# Patient Record
Sex: Female | Born: 1970 | Race: Asian | Hispanic: No | Marital: Married
Health system: Southern US, Community
[De-identification: ages and names within clinical notes are randomized; demographics above are authoritative.]

---

## 1998-06-19 ENCOUNTER — Ambulatory Visit (HOSPITAL_COMMUNITY): Admission: RE | Admit: 1998-06-19 | Discharge: 1998-06-19 | Payer: Self-pay | Admitting: *Deleted

## 1998-08-10 ENCOUNTER — Inpatient Hospital Stay (HOSPITAL_COMMUNITY): Admission: AD | Admit: 1998-08-10 | Discharge: 1998-08-10 | Payer: Self-pay | Admitting: Obstetrics

## 1998-08-11 ENCOUNTER — Inpatient Hospital Stay (HOSPITAL_COMMUNITY): Admission: AD | Admit: 1998-08-11 | Discharge: 1998-08-14 | Payer: Self-pay | Admitting: *Deleted

## 1998-08-17 ENCOUNTER — Inpatient Hospital Stay (HOSPITAL_COMMUNITY): Admission: AD | Admit: 1998-08-17 | Discharge: 1998-08-17 | Payer: Self-pay | Admitting: Obstetrics & Gynecology

## 1998-09-28 ENCOUNTER — Inpatient Hospital Stay (HOSPITAL_COMMUNITY): Admission: AD | Admit: 1998-09-28 | Discharge: 1998-10-01 | Payer: Self-pay | Admitting: Obstetrics & Gynecology

## 1998-09-28 ENCOUNTER — Encounter (INDEPENDENT_AMBULATORY_CARE_PROVIDER_SITE_OTHER): Payer: Self-pay | Admitting: Specialist

## 1998-10-05 ENCOUNTER — Inpatient Hospital Stay (HOSPITAL_COMMUNITY): Admission: AD | Admit: 1998-10-05 | Discharge: 1998-10-05 | Payer: Self-pay | Admitting: Obstetrics & Gynecology

## 1999-06-01 ENCOUNTER — Emergency Department (HOSPITAL_COMMUNITY): Admission: EM | Admit: 1999-06-01 | Discharge: 1999-06-01 | Payer: Self-pay | Admitting: Emergency Medicine

## 2000-04-03 ENCOUNTER — Emergency Department (HOSPITAL_COMMUNITY): Admission: EM | Admit: 2000-04-03 | Discharge: 2000-04-03 | Payer: Self-pay | Admitting: Emergency Medicine

## 2000-04-03 ENCOUNTER — Encounter: Payer: Self-pay | Admitting: Emergency Medicine

## 2000-04-04 ENCOUNTER — Emergency Department (HOSPITAL_COMMUNITY): Admission: EM | Admit: 2000-04-04 | Discharge: 2000-04-04 | Payer: Self-pay | Admitting: Emergency Medicine

## 2001-03-29 ENCOUNTER — Inpatient Hospital Stay (HOSPITAL_COMMUNITY): Admission: AD | Admit: 2001-03-29 | Discharge: 2001-03-29 | Payer: Self-pay | Admitting: Obstetrics & Gynecology

## 2001-03-29 ENCOUNTER — Encounter: Payer: Self-pay | Admitting: Obstetrics & Gynecology

## 2001-07-10 ENCOUNTER — Inpatient Hospital Stay (HOSPITAL_COMMUNITY): Admission: AD | Admit: 2001-07-10 | Discharge: 2001-07-10 | Payer: Self-pay | Admitting: *Deleted

## 2001-07-13 ENCOUNTER — Encounter: Payer: Self-pay | Admitting: *Deleted

## 2001-07-13 ENCOUNTER — Encounter (HOSPITAL_COMMUNITY): Admission: RE | Admit: 2001-07-13 | Discharge: 2001-08-12 | Payer: Self-pay | Admitting: *Deleted

## 2001-08-07 ENCOUNTER — Encounter: Admission: RE | Admit: 2001-08-07 | Discharge: 2001-08-07 | Payer: Self-pay | Admitting: *Deleted

## 2001-09-20 ENCOUNTER — Inpatient Hospital Stay (HOSPITAL_COMMUNITY): Admission: AD | Admit: 2001-09-20 | Discharge: 2001-09-20 | Payer: Self-pay | Admitting: *Deleted

## 2001-10-17 ENCOUNTER — Observation Stay (HOSPITAL_COMMUNITY): Admission: AD | Admit: 2001-10-17 | Discharge: 2001-10-17 | Payer: Self-pay | Admitting: *Deleted

## 2001-10-30 ENCOUNTER — Inpatient Hospital Stay (HOSPITAL_COMMUNITY): Admission: AD | Admit: 2001-10-30 | Discharge: 2001-11-01 | Payer: Self-pay | Admitting: *Deleted

## 2006-02-02 ENCOUNTER — Ambulatory Visit: Payer: Self-pay | Admitting: Gynecology

## 2006-02-16 ENCOUNTER — Encounter (INDEPENDENT_AMBULATORY_CARE_PROVIDER_SITE_OTHER): Payer: Self-pay | Admitting: Specialist

## 2006-02-16 ENCOUNTER — Ambulatory Visit: Payer: Self-pay | Admitting: Gynecology

## 2006-02-16 ENCOUNTER — Other Ambulatory Visit: Admission: RE | Admit: 2006-02-16 | Discharge: 2006-02-16 | Payer: Self-pay | Admitting: Obstetrics and Gynecology

## 2007-07-01 ENCOUNTER — Emergency Department (HOSPITAL_COMMUNITY): Admission: EM | Admit: 2007-07-01 | Discharge: 2007-07-01 | Payer: Self-pay | Admitting: Family Medicine

## 2007-07-02 ENCOUNTER — Emergency Department (HOSPITAL_COMMUNITY): Admission: EM | Admit: 2007-07-02 | Discharge: 2007-07-02 | Payer: Self-pay | Admitting: Emergency Medicine

## 2009-08-12 ENCOUNTER — Other Ambulatory Visit: Admission: RE | Admit: 2009-08-12 | Discharge: 2009-08-12 | Payer: Self-pay | Admitting: Family Medicine

## 2009-09-05 IMAGING — CT CT HEAD W/O CM
1 of 2 series · 16 of 30 positions shown, 20 images · non-contrast
Comparison: 

CLINICAL DATA: FALL FIELD NONE

CT HEAD WITHOUT CONTRAST
TECHNIQUE: Contiguous axial images were obtained from the base of
the skull through the vertex without contrast

[Series 3: recon 2: brain · axial · 0.47mm/px · z∈[+147,+290]mm · 16 of 64 slices shown, 20 images]
[im 4/64  brain]
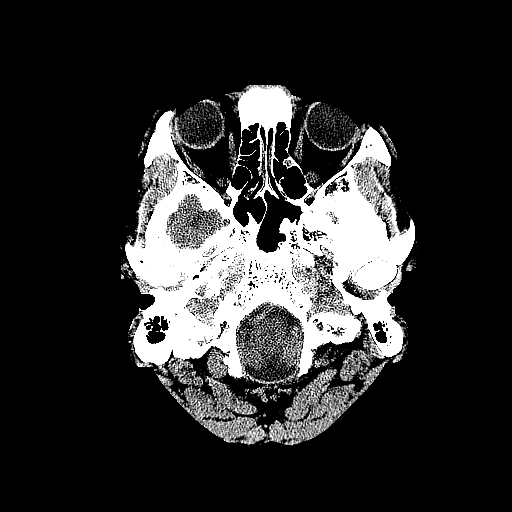
[im 4/64  bone]
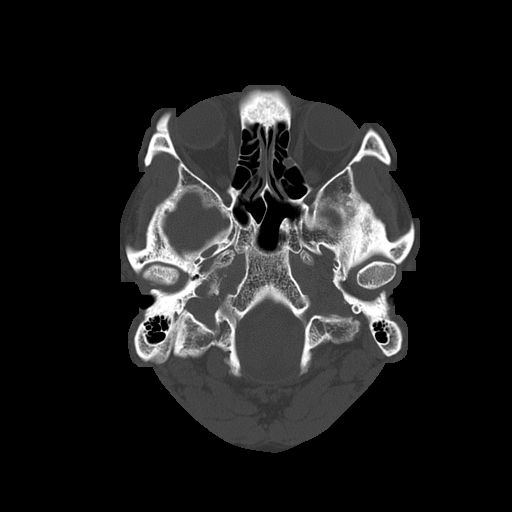
[im 7/64  brain]
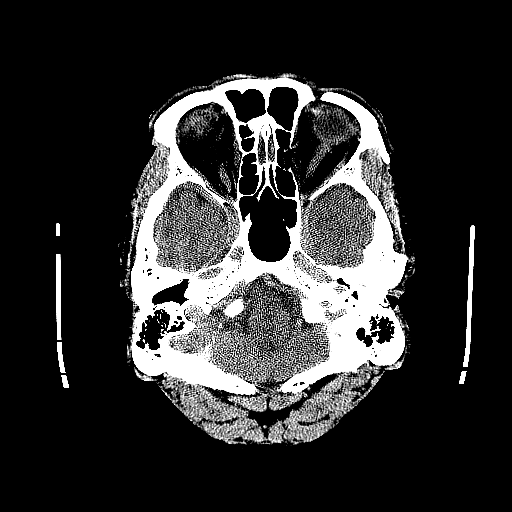
[im 10/64  brain]
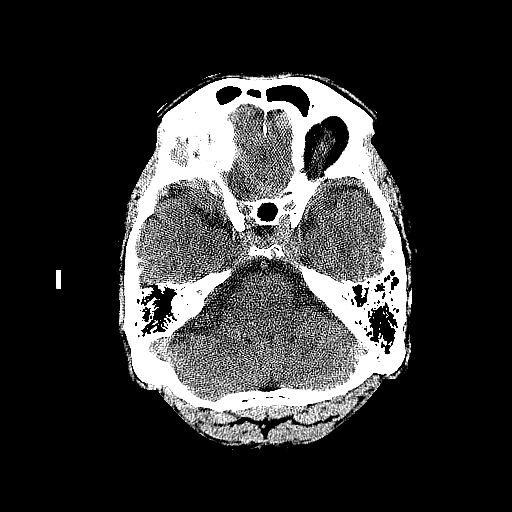
[im 14/64  brain]
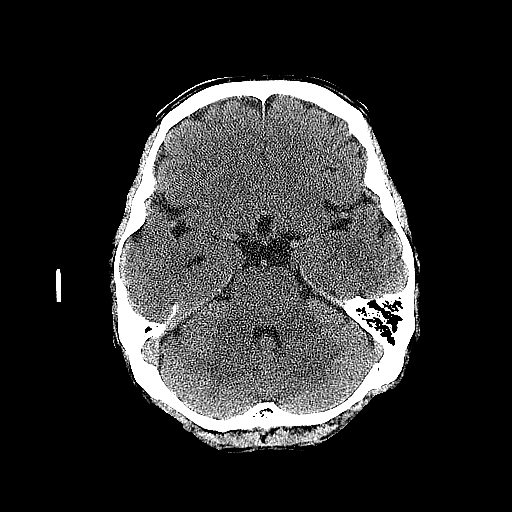
[im 20/64  brain]
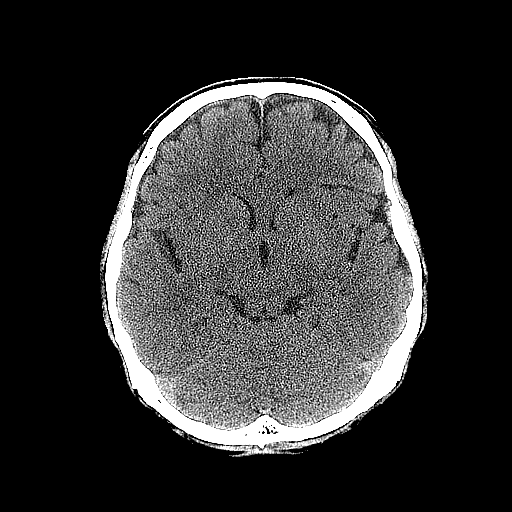
[im 20/64  bone]
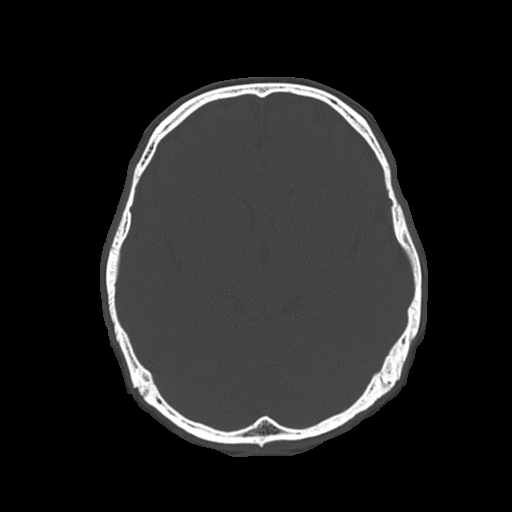
[im 24/64  brain]
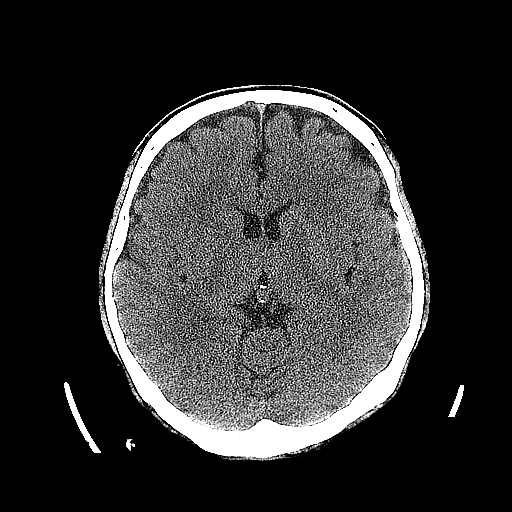
[im 27/64  brain]
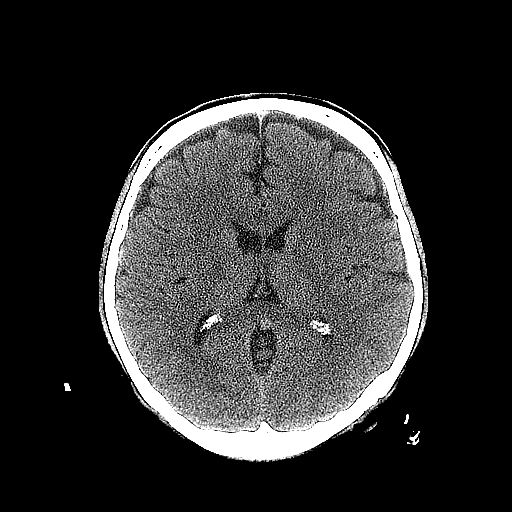
[im 30/64  brain]
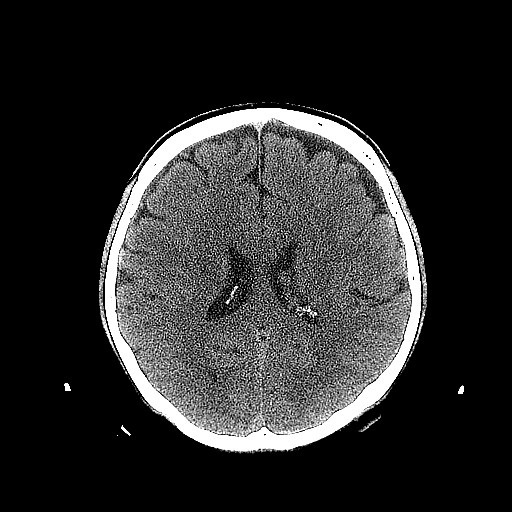
[im 34/64  brain]
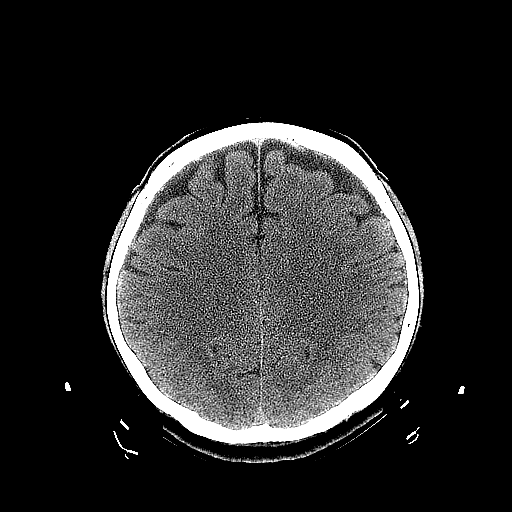
[im 34/64  bone]
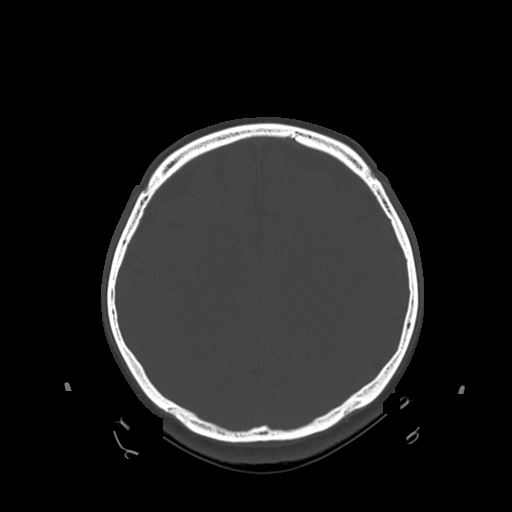
[im 37/64  brain]
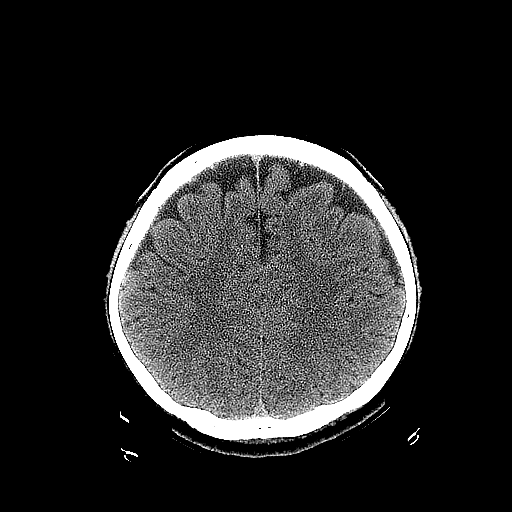
[im 40/64  brain]
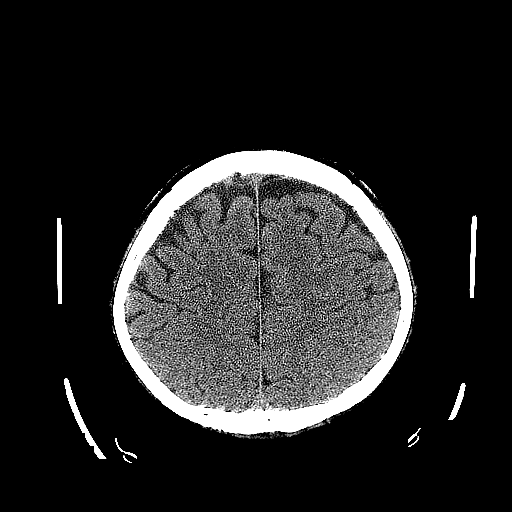
[im 44/64  brain]
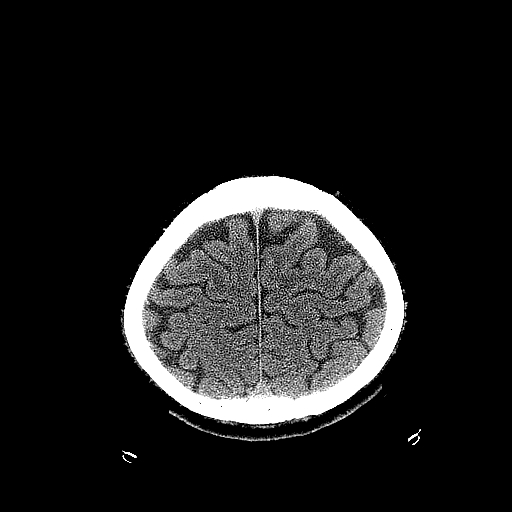
[im 50/64  brain]
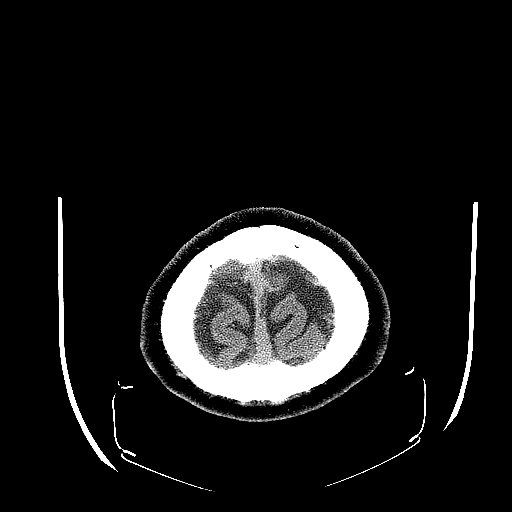
[im 50/64  bone]
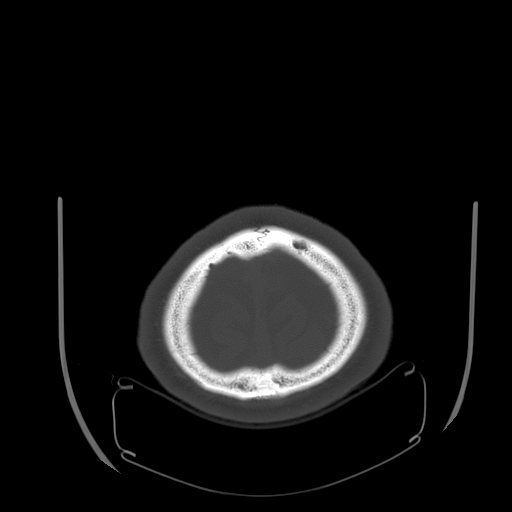
[im 54/64  brain]
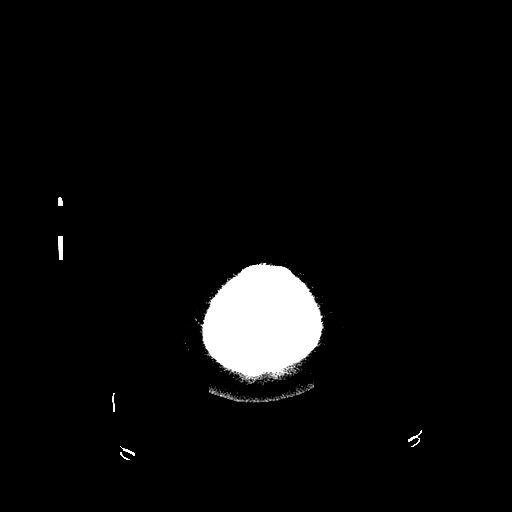
[im 57/64  brain]
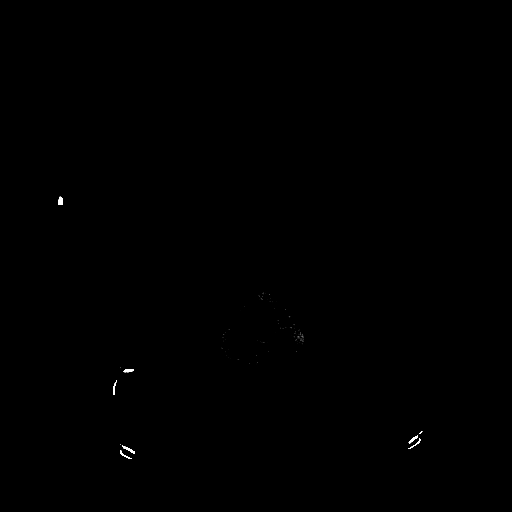
[im 60/64  brain]
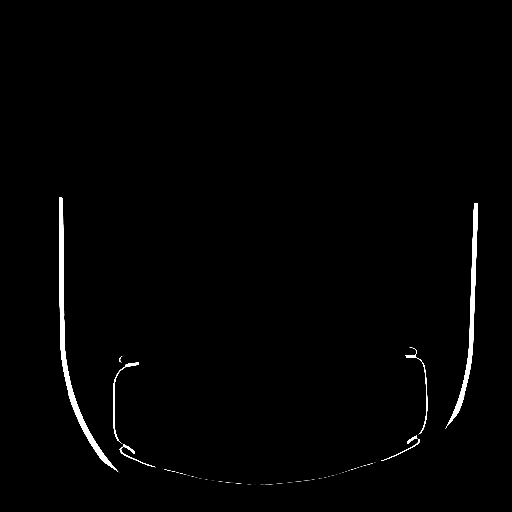

[16 of 30 positions shown; findings below may reference images not displayed]

FINDINGS: The brain has a normal appearance without evidence for
hemorrhage, acute infarction, hydrocephalus, or mass lesion.  There
is no extra axial fluid collection.  The skull and paranasal
sinuses are normal.
IMPRESSION: Normal CT of the head without contrast.

## 2011-01-06 ENCOUNTER — Other Ambulatory Visit: Payer: Self-pay | Admitting: Family Medicine

## 2011-01-06 DIAGNOSIS — Z1231 Encounter for screening mammogram for malignant neoplasm of breast: Secondary | ICD-10-CM

## 2011-01-12 ENCOUNTER — Ambulatory Visit
Admission: RE | Admit: 2011-01-12 | Discharge: 2011-01-12 | Disposition: A | Discharge status: Home / Self Care | Payer: BC Managed Care – PPO | Source: Ambulatory Visit | Attending: Family Medicine | Admitting: Family Medicine

## 2011-01-12 DIAGNOSIS — Z1231 Encounter for screening mammogram for malignant neoplasm of breast: Secondary | ICD-10-CM

## 2011-08-22 ENCOUNTER — Other Ambulatory Visit: Payer: Self-pay | Admitting: Physician Assistant

## 2011-08-22 ENCOUNTER — Other Ambulatory Visit (HOSPITAL_COMMUNITY)
Admission: RE | Admit: 2011-08-22 | Discharge: 2011-08-22 | Disposition: A | Discharge status: Home / Self Care | Payer: BC Managed Care – PPO | Source: Ambulatory Visit | Attending: Family Medicine | Admitting: Family Medicine

## 2011-08-22 DIAGNOSIS — Z0000 Encounter for general adult medical examination without abnormal findings: Secondary | ICD-10-CM | POA: Insufficient documentation

## 2012-11-15 ENCOUNTER — Other Ambulatory Visit: Payer: Self-pay | Admitting: Family Medicine

## 2012-11-15 DIAGNOSIS — Z1231 Encounter for screening mammogram for malignant neoplasm of breast: Secondary | ICD-10-CM

## 2012-11-26 ENCOUNTER — Ambulatory Visit: Payer: BC Managed Care – PPO

## 2015-05-27 ENCOUNTER — Other Ambulatory Visit: Payer: Self-pay | Admitting: Family Medicine

## 2015-05-27 DIAGNOSIS — N644 Mastodynia: Secondary | ICD-10-CM

## 2015-06-01 ENCOUNTER — Ambulatory Visit
Admission: RE | Admit: 2015-06-01 | Discharge: 2015-06-01 | Disposition: A | Discharge status: Home / Self Care | Payer: No Typology Code available for payment source | Source: Ambulatory Visit | Attending: Family Medicine | Admitting: Family Medicine

## 2015-06-01 DIAGNOSIS — N644 Mastodynia: Secondary | ICD-10-CM

## 2021-12-28 ENCOUNTER — Encounter: Payer: Self-pay | Admitting: Plastic Surgery

## 2021-12-28 ENCOUNTER — Ambulatory Visit (INDEPENDENT_AMBULATORY_CARE_PROVIDER_SITE_OTHER): Payer: Self-pay | Admitting: Plastic Surgery

## 2021-12-28 DIAGNOSIS — Z719 Counseling, unspecified: Secondary | ICD-10-CM

## 2021-12-28 NOTE — Progress Notes (Signed)
   Subjective:    Patient ID: Dawn Palmer, female    DOB: March 27, 1971, 51 y.o.   MRN: [id number]  The patient is a 51 years old female here for evaluation of her face.  She had filler in the past about one year ago.  I think overall she has a very nice place.  She has some mild hyperpigmentation.  I think it is possible the filler is still in place.  I do not think adding any today would be beneficial.      Review of Systems  Constitutional: Negative.   Eyes: Negative.   Respiratory: Negative.    Cardiovascular: Negative.   Gastrointestinal: Negative.   Endocrine: Negative.   Genitourinary: Negative.        Objective:   Physical Exam Constitutional:      Appearance: Normal appearance.  Cardiovascular:     Rate and Rhythm: Normal rate.  Skin:    Capillary Refill: Capillary refill takes less than 2 seconds.  Neurological:     Mental Status: She is alert and oriented to person, place, and time.  Psychiatric:        Mood and Affect: Mood normal.        Behavior: Behavior normal.        Thought Content: Thought content normal.        Judgment: Judgment normal.           Assessment & Plan:     ICD-10-CM   1. 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  Follow-up as needed.
